# Patient Record
Sex: Male | Born: 1965 | Race: Black or African American | Hispanic: No | Marital: Married | Smoking: Former smoker
Health system: Southern US, Community
[De-identification: ages and names within clinical notes are randomized; demographics above are authoritative.]

## PROBLEM LIST (undated history)

## (undated) DIAGNOSIS — G4730 Sleep apnea, unspecified: Secondary | ICD-10-CM

## (undated) DIAGNOSIS — F419 Anxiety disorder, unspecified: Secondary | ICD-10-CM

## (undated) HISTORY — DX: Sleep apnea, unspecified: G47.30

## (undated) HISTORY — DX: Anxiety disorder, unspecified: F41.9

---

## 2006-12-17 ENCOUNTER — Emergency Department: Payer: Self-pay | Admitting: Emergency Medicine

## 2009-09-28 ENCOUNTER — Emergency Department: Payer: Self-pay | Admitting: Emergency Medicine

## 2010-04-05 ENCOUNTER — Emergency Department: Payer: Self-pay | Admitting: Emergency Medicine

## 2010-04-14 ENCOUNTER — Emergency Department: Payer: Self-pay | Admitting: Emergency Medicine

## 2015-01-08 ENCOUNTER — Other Ambulatory Visit: Payer: Self-pay | Admitting: Physician Assistant

## 2015-01-08 DIAGNOSIS — R221 Localized swelling, mass and lump, neck: Secondary | ICD-10-CM

## 2015-01-09 ENCOUNTER — Other Ambulatory Visit: Payer: Self-pay

## 2015-01-15 ENCOUNTER — Ambulatory Visit
Admission: RE | Admit: 2015-01-15 | Discharge: 2015-01-15 | Disposition: A | Discharge status: Home / Self Care | Payer: Commercial Managed Care - PPO | Source: Ambulatory Visit | Attending: Physician Assistant | Admitting: Physician Assistant

## 2015-01-15 DIAGNOSIS — R221 Localized swelling, mass and lump, neck: Secondary | ICD-10-CM | POA: Diagnosis present

## 2015-01-15 MED ORDER — IOHEXOL 350 MG/ML SOLN
75.0000 mL | Freq: Once | INTRAVENOUS | Status: AC | PRN
  Administered 2015-01-15: 75 mL via INTRAVENOUS

## 2015-01-19 ENCOUNTER — Encounter: Payer: Self-pay | Admitting: *Deleted

## 2015-01-20 ENCOUNTER — Encounter: Payer: Self-pay | Admitting: Oncology

## 2015-01-20 ENCOUNTER — Inpatient Hospital Stay: Payer: Commercial Managed Care - PPO

## 2015-01-20 ENCOUNTER — Inpatient Hospital Stay: Payer: Commercial Managed Care - PPO | Attending: Oncology | Admitting: Oncology

## 2015-01-20 VITALS — BP 133/87 | HR 65 | Temp 97.0°F | Wt 241.6 lb

## 2015-01-20 DIAGNOSIS — R945 Abnormal results of liver function studies: Secondary | ICD-10-CM | POA: Diagnosis not present

## 2015-01-20 DIAGNOSIS — Z801 Family history of malignant neoplasm of trachea, bronchus and lung: Secondary | ICD-10-CM | POA: Diagnosis not present

## 2015-01-20 DIAGNOSIS — Z87891 Personal history of nicotine dependence: Secondary | ICD-10-CM

## 2015-01-20 DIAGNOSIS — Z800 Family history of malignant neoplasm of digestive organs: Secondary | ICD-10-CM | POA: Diagnosis not present

## 2015-01-20 DIAGNOSIS — R221 Localized swelling, mass and lump, neck: Secondary | ICD-10-CM

## 2015-01-20 LAB — CBC WITH DIFFERENTIAL/PLATELET
BASOS ABS: 0.1 10*3/uL (ref 0–0.1)
Basophils Relative: 1 %
Eosinophils Absolute: 0.1 10*3/uL (ref 0–0.7)
Eosinophils Relative: 2 %
HCT: 46.8 % (ref 40.0–52.0)
HEMOGLOBIN: 15.7 g/dL (ref 13.0–18.0)
LYMPHS ABS: 4.2 10*3/uL — AB (ref 1.0–3.6)
Lymphocytes Relative: 62 %
MCH: 30.1 pg (ref 26.0–34.0)
MCHC: 33.5 g/dL (ref 32.0–36.0)
MCV: 89.6 fL (ref 80.0–100.0)
MONOS PCT: 8 %
Monocytes Absolute: 0.5 10*3/uL (ref 0.2–1.0)
Neutro Abs: 1.8 10*3/uL (ref 1.4–6.5)
Neutrophils Relative %: 27 %
PLATELETS: 200 10*3/uL (ref 150–440)
RBC: 5.22 MIL/uL (ref 4.40–5.90)
RDW: 13.8 % (ref 11.5–14.5)
WBC: 6.7 10*3/uL (ref 3.8–10.6)

## 2015-01-20 LAB — COMPREHENSIVE METABOLIC PANEL
ALT: 45 U/L (ref 17–63)
AST: 205 U/L — AB (ref 15–41)
Albumin: 4 g/dL (ref 3.5–5.0)
Alkaline Phosphatase: 78 U/L (ref 38–126)
Anion gap: 4 — ABNORMAL LOW (ref 5–15)
BUN: 13 mg/dL (ref 6–20)
CALCIUM: 8.8 mg/dL — AB (ref 8.9–10.3)
CO2: 27 mmol/L (ref 22–32)
CREATININE: 1.15 mg/dL (ref 0.61–1.24)
Chloride: 104 mmol/L (ref 101–111)
GFR calc Af Amer: >60 mL/min (ref >60)
Glucose, Bld: 92 mg/dL (ref 65–99)
Potassium: 3.9 mmol/L (ref 3.5–5.1)
Sodium: 135 mmol/L (ref 135–145)
Total Bilirubin: 0.6 mg/dL (ref 0.3–1.2)
Total Protein: 8.5 g/dL — ABNORMAL HIGH (ref 6.5–8.1)

## 2015-01-20 NOTE — Progress Notes (Signed)
[facility] Telephone:([phone]) [phone]  Fax:(336) [phone]   INITIAL CONSULT  Louis Ortiz OB: 12-02-1965  MR#: [id number]  VJK#:[id number]  Patient Care Team: Geoffery Lyons, MD as PCP - General (Physician Assistant) Linus Salmons, MD as Consulting Physician (Unknown Physician Specialty)  CHIEF COMPLAINT:  Chief Complaint  Patient presents with  . New Evaluation    VISIT DIAGNOSIS:     ICD-9-CM ICD-10-CM   1. Neck mass 784.2 R22.1 CBC with Differential     Comprehensive metabolic panel      No history exists.   2.  Abnormal CT scan of the neck (January 15, 2015) No flowsheet data found.  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  According to him he told us somewhat decrease in size.  Patient was evaluated by primary care physician and patient underwent extensive evaluation which sleep studies CT scan and ultrasound. CT scan was abnormal showing irregular thickening of the right lateral wall of oropharynx and hypopharynx extending 3 cm as the liver no pop or epiglottis.  There was some small enlarged lymph node were found.  This and also underwent ultrasound study of abdomen which was reported to be normal.  Patient was referred to me for abnormal CT scan of the head and neck. REVIEW OF SYSTEMS:  Gen. status: Good performance status. HEENT: Denies any difficulty swallowing pain and sore throat Lungs: No cough or shortness of breath Cardiac: No chest pain or shortness of breath GI: No abdominal pain no nausea.  No rectal bleeding. Lower extremity no swelling GU: No dysuria hematuria had documented normal PSA in July 2016 Musculoskeletal system no bony pain Endocrine system within normal limit Skin: No rash Neurological system: No tingling numbness or no other abnormality  As per HPI. Otherwise, a complete review of systems is negatve.  PAST MEDICAL HISTORY: Patient has allergic  rhinitis Insomnia Carpal tunnel syndrome  PAST SURGICAL HISTORY: No significant pasta surgical history  FAMILY HISTORY Family history of lung cancer and stomach cancer and diabetes     ADVANCED DIRECTIVES:  Does not have any advance care directive   HEALTH MAINTENANCE: History  Substance Use Topics  . Smoking status: Former Smoker    Quit date: 01/19/1994  . Smokeless tobacco: Not on file  . Alcohol Use: Not on file     Patient is social drinker  No Known Allergies  No current outpatient prescriptions on file.   No current facility-administered medications for this visit.    OBJECTIVE: PHYSICAL EXAM: Gen. status: Patient is alert oriented and not in any acute distress HEENT: Small palpable submandibular lymph node. Enlargement of both tonsils right bigger than left. No other mass visible Examination of the chest was unremarkable. There were no bony deformities, no asymmetry, and no other abnormalities.  Cardiac exam revealed the PMI to be normally situated and sized. The rhythm was regular and no extrasystoles were noted during several minutes of auscultation. The first and second heart sounds were normal and physiologic splitting of the second heart sound was noted. There were no murmurs, rubs, clicks, or gallops. Abdominal exam revealed normal bowel sounds. The abdomen was soft, non-tender, and without masses, organomegaly, or appreciable enlargement of the abdominal aorta. Examination of the skin revealed no evidence of significant rashes, suspicious appearing nevi or other concerning lesions. Musculoskeletal system: Within normal limits no joint swelling Neurologically, the patient was awake, alert, and oriented to person, place and time. There were  no obvious focal neurologic abnormalities.  Filed Vitals:   01/20/15 1528  BP: 133/87  Pulse: 65  Temp:      Body mass index is 32.76 kg/(m^2).    ECOG FS:0 - Asymptomatic  LAB RESULTS: Lab data from outside  institution WBC 6.5.  Hemoglobin 15.4.  Platelet count 296,000.  Serum creatinine 1.24.  Bilirubin 0.4.  Potassium 9.3.  SGOT 202 SGPT 47 alkaline phosphatase within normal limit   STUDIES: Ct Soft Tissue Neck W Contrast  01/16/2015   CLINICAL DATA:  Submandibular region swelling, 4 months duration.  EXAM: CT NECK WITH CONTRAST  TECHNIQUE: Multidetector CT imaging of the neck was performed using the standard protocol following the bolus administration of intravenous contrast.  CONTRAST:  75mL OMNIPAQUE IOHEXOL 350 MG/ML SOLN  COMPARISON:  Radiography 04/14/2010  FINDINGS: Pharynx and larynx: The areas irregular thickening of the right lateral wall of the oropharynx to hypopharynx, extending over a length of 3 cm as low as the level of the upper epiglottis. This is worrisome for carcinoma. No other mucosal or submucosal lesion seen.  Salivary glands: Parotid and submandibular glands are intrinsically normal.  Thyroid: Normal  Lymph nodes: There are enlarged level 1 lymph nodes on the right, the lymph node marked by the patient measuring 13 x 17 mm in diameter and a noted medially adjacent to the right submandibular gland measuring 11 x 21 mm in diameter. There are no enlarged level 2 or below lymph nodes by size criteria, but the lymph nodes in the right side of the neck compared to the left are slightly asymmetrically more prominent, raising some concern.  Vascular: Arterial and venous structures are patent. Mild atherosclerosis of the carotid bifurcations.  Limited intracranial: Normal  Visualized orbits: Normal  Mastoids and visualized paranasal sinuses: Clear  Skeleton: Spondylosis C5-6.  Upper chest: Clear  IMPRESSION: Thickening and irregularity along the right lateral oropharynx and hypopharynx extending over a length of 3 cm, worrisome for carcinoma. Enlarged level 1 lymph nodes worrisome for metastatic disease. Slight prominence of the nodes of the neck in the lower nodal stations compared to the left  raising some concern. However, these are not pathologic by size criteria at this time.   Electronically Signed   By: Paulina Fusi M.D.   On: 01/16/2015 08:10    ASSESSMENT:  1.  Abnormal  CT scan of the soft tissue of the neck.  CT scan has been reviewed independently and reviewed with the patient. 2, abnormal liver enzymes Outside ultrasound was with reported to be negative for abdomen Hepatitis profile has been negative for hepatitis B and C. PLAN: ENT evaluation Repeat liver enzymes If they continue to be abnormal then further CT scan of the liver can be done HIV evaluation if needed Depending on the further ENT evaluation and biopsy consider further treatment planning Discussed case in tumor conference.  Patient expressed understanding and was in agreement with this plan. He also understands that He can call clinic at any time with any questions, concerns, or complaints.    No matching staging information was found for the patient.  Johney Maine, MD   01/20/2015 4:54 PM

## 2015-01-20 NOTE — Progress Notes (Signed)
Patient does not have living will.  Former smoker. Quit 1995.

## 2015-01-21 ENCOUNTER — Ambulatory Visit: Payer: Commercial Managed Care - PPO | Admitting: Oncology

## 2015-01-29 ENCOUNTER — Ambulatory Visit: Payer: Self-pay | Admitting: General Surgery

## 2015-02-26 ENCOUNTER — Encounter: Payer: Self-pay | Admitting: *Deleted

## 2015-03-04 NOTE — Discharge Instructions (Signed)

## 2015-03-06 ENCOUNTER — Ambulatory Visit: Payer: Commercial Managed Care - PPO | Admitting: Anesthesiology

## 2015-03-06 ENCOUNTER — Encounter
Admission: RE | Disposition: A | Discharge status: Home / Self Care | Payer: Self-pay | Source: Ambulatory Visit | Attending: Unknown Physician Specialty

## 2015-03-06 ENCOUNTER — Encounter: Payer: Self-pay | Admitting: Anesthesiology

## 2015-03-06 ENCOUNTER — Ambulatory Visit
Admission: RE | Admit: 2015-03-06 | Discharge: 2015-03-06 | Disposition: A | Discharge status: Home / Self Care | Payer: Commercial Managed Care - PPO | Source: Ambulatory Visit | Attending: Unknown Physician Specialty | Admitting: Unknown Physician Specialty

## 2015-03-06 DIAGNOSIS — R748 Abnormal levels of other serum enzymes: Secondary | ICD-10-CM | POA: Diagnosis not present

## 2015-03-06 DIAGNOSIS — Z809 Family history of malignant neoplasm, unspecified: Secondary | ICD-10-CM | POA: Insufficient documentation

## 2015-03-06 DIAGNOSIS — Z833 Family history of diabetes mellitus: Secondary | ICD-10-CM | POA: Diagnosis not present

## 2015-03-06 DIAGNOSIS — K219 Gastro-esophageal reflux disease without esophagitis: Secondary | ICD-10-CM | POA: Diagnosis not present

## 2015-03-06 DIAGNOSIS — R590 Localized enlarged lymph nodes: Secondary | ICD-10-CM | POA: Insufficient documentation

## 2015-03-06 DIAGNOSIS — Z811 Family history of alcohol abuse and dependence: Secondary | ICD-10-CM | POA: Insufficient documentation

## 2015-03-06 DIAGNOSIS — Z8261 Family history of arthritis: Secondary | ICD-10-CM | POA: Insufficient documentation

## 2015-03-06 DIAGNOSIS — Z87891 Personal history of nicotine dependence: Secondary | ICD-10-CM | POA: Diagnosis not present

## 2015-03-06 DIAGNOSIS — R221 Localized swelling, mass and lump, neck: Secondary | ICD-10-CM | POA: Diagnosis present

## 2015-03-06 HISTORY — PX: MASS EXCISION: SHX2000

## 2015-03-06 SURGERY — MINOR EXCISION OF MASS
Anesthesia: General | Wound class: Clean

## 2015-03-06 MED ORDER — OXYCODONE HCL 5 MG/5ML PO SOLN: 5.0000 mg | Freq: Once | ORAL | Status: DC | PRN

## 2015-03-06 MED ORDER — OXYCODONE HCL 5 MG PO TABS: 5.0000 mg | ORAL_TABLET | Freq: Once | ORAL | Status: DC | PRN

## 2015-03-06 MED ORDER — DEXAMETHASONE SODIUM PHOSPHATE 4 MG/ML IJ SOLN
INTRAMUSCULAR | Status: DC | PRN
  Administered 2015-03-06: 10 mg via INTRAVENOUS

## 2015-03-06 MED ORDER — LACTATED RINGERS IV SOLN
INTRAVENOUS | Status: DC
  Administered 2015-03-06: 12:00:00 via INTRAVENOUS

## 2015-03-06 MED ORDER — ONDANSETRON HCL 4 MG/2ML IJ SOLN
INTRAMUSCULAR | Status: DC | PRN
  Administered 2015-03-06: 4 mg via INTRAVENOUS

## 2015-03-06 MED ORDER — HYDROCODONE-ACETAMINOPHEN 5-300 MG PO TABS: 1.0000 | ORAL_TABLET | ORAL | Status: DC | PRN

## 2015-03-06 MED ORDER — FENTANYL CITRATE (PF) 100 MCG/2ML IJ SOLN
INTRAMUSCULAR | Status: DC | PRN
  Administered 2015-03-06: 100 ug via INTRAVENOUS

## 2015-03-06 MED ORDER — MIDAZOLAM HCL 5 MG/5ML IJ SOLN
INTRAMUSCULAR | Status: DC | PRN
  Administered 2015-03-06: 2 mg via INTRAVENOUS

## 2015-03-06 MED ORDER — LIDOCAINE-EPINEPHRINE 1 %-1:100000 IJ SOLN
INTRAMUSCULAR | Status: DC | PRN
  Administered 2015-03-06: 8 mL

## 2015-03-06 MED ORDER — PROPOFOL 10 MG/ML IV BOLUS
INTRAVENOUS | Status: DC | PRN
  Administered 2015-03-06: 200 mg via INTRAVENOUS

## 2015-03-06 MED ORDER — GLYCOPYRROLATE 0.2 MG/ML IJ SOLN
INTRAMUSCULAR | Status: DC | PRN
  Administered 2015-03-06: .1 mg via INTRAVENOUS

## 2015-03-06 MED ORDER — LIDOCAINE HCL (CARDIAC) 20 MG/ML IV SOLN
INTRAVENOUS | Status: DC | PRN
  Administered 2015-03-06: 50 mg via INTRATRACHEAL

## 2015-03-06 SURGICAL SUPPLY — 28 items
APPLICATOR COTTON TIP WD 3 STR (MISCELLANEOUS) IMPLANT
BLADE SURG 15 STRL LF DISP TIS (BLADE) IMPLANT
BLADE SURG 15 STRL SS (BLADE)
CANISTER SUCT 1200ML W/VALVE (MISCELLANEOUS) IMPLANT
CORD BIP STRL DISP 12FT (MISCELLANEOUS) IMPLANT
DRAPE HEAD BAR (DRAPES) IMPLANT
DRESSING TELFA 4X3 1S ST N-ADH (GAUZE/BANDAGES/DRESSINGS) IMPLANT
ELECT CAUTERY BLADE TIP 2.5 (TIP) ×3
ELECT CAUTERY NEEDLE 2.0 MIC (NEEDLE) IMPLANT
ELECTRODE CAUTERY BLDE TIP 2.5 (TIP) ×1 IMPLANT
GAUZE SPONGE 4X4 12PLY STRL (GAUZE/BANDAGES/DRESSINGS) IMPLANT
GLOVE BIO SURGEON STRL SZ7.5 (GLOVE) ×6 IMPLANT
LIQUID BAND (GAUZE/BANDAGES/DRESSINGS) IMPLANT
NEEDLE HYPO 25GX1X1/2 BEV (NEEDLE) ×3 IMPLANT
NS IRRIG 500ML POUR BTL (IV SOLUTION) ×3 IMPLANT
PACK DRAPE NASAL/ENT (PACKS) ×3 IMPLANT
PAD GROUND ADULT SPLIT (MISCELLANEOUS) ×3 IMPLANT
PENCIL ELECTRO HAND CTR (MISCELLANEOUS) ×3 IMPLANT
SOL PREP PVP 2OZ (MISCELLANEOUS)
SOLUTION PREP PVP 2OZ (MISCELLANEOUS) IMPLANT
STRAP BODY AND KNEE 60X3 (MISCELLANEOUS) ×3 IMPLANT
SUCTION FRAZIER TIP 10 FR DISP (SUCTIONS) IMPLANT
SUT PROLENE 4 0 PS 2 18 (SUTURE) ×3 IMPLANT
SUT PROLENE 5 0 P 3 (SUTURE) IMPLANT
SUT VIC AB 4-0 RB1 27 (SUTURE)
SUT VIC AB 4-0 RB1 27X BRD (SUTURE) IMPLANT
SYRINGE 10CC LL (SYRINGE) ×3 IMPLANT
TOWEL OR 17X26 4PK STRL BLUE (TOWEL DISPOSABLE) ×3 IMPLANT

## 2015-03-06 NOTE — Anesthesia Postprocedure Evaluation (Signed)
  Anesthesia Post-op Note  Patient: Louis Ortiz  Procedure(s) Performed: Procedure(s): MINOR EXCISION OF MASS EXC DEEP SUBMENTAL LYMPH NODE (N/A)  Anesthesia type:General  Patient location: PACU  Post pain: Pain level controlled  Post assessment: Post-op Vital signs reviewed, Patient's Cardiovascular Status Stable, Respiratory Function Stable, Patent Airway and No signs of Nausea or vomiting  Post vital signs: Reviewed and stable  Last Vitals:  Filed Vitals:   03/06/15 1346  BP: 107/71  Pulse: 55  Temp:   Resp: 13    Level of consciousness: awake, alert  and patient cooperative  Complications: No apparent anesthesia complications

## 2015-03-06 NOTE — Anesthesia Procedure Notes (Signed)
Procedure Name: LMA Insertion Performed by: Jeani Fassnacht Pre-anesthesia Checklist: Patient identified, Emergency Drugs available, Suction available, Timeout performed and Patient being monitored Patient Re-evaluated:Patient Re-evaluated prior to inductionOxygen Delivery Method: Circle system utilized Preoxygenation: Pre-oxygenation with 100% oxygen Intubation Type: IV induction LMA: LMA inserted LMA Size: 5.0 Number of attempts: 1 Placement Confirmation: positive ETCO2 and breath sounds checked- equal and bilateral Tube secured with: Tape     

## 2015-03-06 NOTE — Anesthesia Preprocedure Evaluation (Addendum)
Anesthesia Evaluation  Patient identified by MRN, date of birth, ID band  Reviewed: NPO status   History of Anesthesia Complications Negative for: history of anesthetic complications  Airway Mallampati: II  TM Distance: >3 FB Neck ROM: full    Dental  (+) Missing, Chipped, Poor Dentition,    Pulmonary neg pulmonary ROS, former smoker,    Pulmonary exam normal       Cardiovascular Exercise Tolerance: Good negative cardio ROS Normal cardiovascular exam    Neuro/Psych negative neurological ROS  negative psych ROS   GI/Hepatic negative GI ROS, Elevated LFTs.   Endo/Other  negative endocrine ROS  Renal/GU negative Renal ROS  negative genitourinary   Musculoskeletal   Abdominal   Peds  Hematology negative hematology ROS (+)   Anesthesia Other Findings Neck CT: Thickening and irregularity along the right lateral oropharynx and hypopharynx extending over a length of 3 cm.  Reproductive/Obstetrics                            Anesthesia Physical Anesthesia Plan  ASA: II  Anesthesia Plan: General   Post-op Pain Management:    Induction:   Airway Management Planned:   Additional Equipment:   Intra-op Plan:   Post-operative Plan:   Informed Consent: I have reviewed the patients History and Physical, chart, labs and discussed the procedure including the risks, benefits and alternatives for the proposed anesthesia with the patient or authorized representative who has indicated his/her understanding and acceptance.     Plan Discussed with: CRNA  Anesthesia Plan Comments:        Anesthesia Quick Evaluation

## 2015-03-06 NOTE — Transfer of Care (Signed)
Immediate Anesthesia Transfer of Care Note  Patient: Louis Ortiz  Procedure(s) Performed: Procedure(s): MINOR EXCISION OF MASS EXC DEEP SUBMENTAL LYMPH NODE (N/A)  Patient Location: PACU  Anesthesia Type: General  Level of Consciousness: awake, alert  and patient cooperative  Airway and Oxygen Therapy: Patient Spontanous Breathing and Patient connected to supplemental oxygen  Post-op Assessment: Post-op Vital signs reviewed, Patient's Cardiovascular Status Stable, Respiratory Function Stable, Patent Airway and No signs of Nausea or vomiting  Post-op Vital Signs: Reviewed and stable  Complications: No apparent anesthesia complications

## 2015-03-06 NOTE — Brief Op Note (Signed)
03/06/2015  1:22 PM  PATIENT:  Louis Ortiz  49 y.o. male  PRE-OPERATIVE DIAGNOSIS:  NECK MASS R22.1  POST-OPERATIVE DIAGNOSIS:  * No post-op diagnosis entered *  PROCEDURE:  Procedure(s): MINOR EXCISION OF MASS EXC DEEP SUBMENTAL LYMPH NODE (N/A)  SURGEON:  Surgeon(s) and Role:    * Linus Salmons, MD - Primary  PHYSICIAN ASSISTANT:   ASSISTANTS: none   ANESTHESIA:   general  EBL:     BLOOD ADMINISTERED:none  DRAINS: none   LOCAL MEDICATIONS USED:  LIDOCAINE   SPECIMEN:  Source of Specimen:  submental lymph node  DISPOSITION OF SPECIMEN:  PATHOLOGY  COUNTS:  NO yes they were correct  TOURNIQUET:  * No tourniquets in log *  DICTATION: .Other Dictation: Dictation Number [id number]  PLAN OF CARE: Discharge to home after PACU  PATIENT DISPOSITION:  PACU - hemodynamically stable.   Delay start of Pharmacological VTE agent (>24hrs) due to surgical blood loss or risk of bleeding: not applicable

## 2015-03-06 NOTE — H&P (Signed)
  H+P  Reviewed and will be scanned in later. No changes noted. 

## 2015-03-07 NOTE — Op Note (Signed)
NAMEGAEGE, SANGALANG NO.:  [id number]  MEDICAL RECORD NO.:  [id number]  LOCATION:  [facility]                        FACILITY:  [facility]  PHYSICIAN:  Davina Poke, MD  DATE OF BIRTH:  04/09/1966  DATE OF PROCEDURE:  03/06/2015 DATE OF DISCHARGE:  03/06/2015                              OPERATIVE REPORT   PREOPERATIVE DIAGNOSIS:  Submental lymph node.  POSTOPERATIVE DIAGNOSIS:  Submental lymph node.  ATTENDING SURGEON:  Davina Poke, M.D.  PROCEDURE PERFORMED:  Excision of a deep submental lymph node.  SURGEON:  Davina Poke, MD  OPERATIVE FINDINGS:  Approximately 2 x 2 cm soft lymph node in the submental region.  DESCRIPTION OF PROCEDURE:  Carlin was identified in the holding area, taken to the operating room, placed in supine position.  After general laryngeal mask anesthesia, the neck was prepped and draped sterilely. An incision line was marked overlying the palpable mass.  A local anesthetic of 1% lidocaine with 1:1000 epinephrine was used to inject over this.  A total of 2 mL was used.  When this completed, a 15 blade was used to incise down to and through the platysmal muscle.  Hemostasis was achieved using the bipolar cautery.  Using blunt dissection and sharp dissection, the wound was probed.  There was an obvious lymph node which was palpable.  This was gently dissected free.  There was 1 small feeding vessel which was divided between clamps and sutured using a 2-0 silk suture ligature.  The micro-bipolar was then used to remove the lymph node from its bed.  When this completed, the wound was copiously irrigated with saline.  Any small bleeding points were cauterized using the micro-bipolar.  The platysmal layer was closed using 4-0 Vicryl, and the skin was closed using 4-0 running Prolene.  The patient was then returned to Anesthesia where he was awakened in the operating room, taken to the recovery room in stable  condition.  CULTURES:  None.  SPECIMEN:  Submental lymph node.  ESTIMATED BLOOD LOSS:  Less than 5 mL.          ______________________________ Davina Poke, MD     CTM/MEDQ  D:  03/06/2015  T:  03/07/2015  Job:  [id number]

## 2015-03-09 ENCOUNTER — Encounter: Payer: Self-pay | Admitting: Unknown Physician Specialty

## 2015-03-11 ENCOUNTER — Encounter: Payer: Self-pay | Admitting: Oncology

## 2015-03-12 LAB — SURGICAL PATHOLOGY

## 2015-08-11 IMAGING — CT CT NECK W/ CM
4 of 5 series · 15 of 33 positions shown, 17 images · IV contrast (omnipaque)
Comparison: Radiography 04/14/2010

CLINICAL DATA: Submandibular region swelling, 4 months duration.

EXAM:
CT NECK WITH CONTRAST
TECHNIQUE: Multidetector CT imaging of the neck was performed using the
standard protocol following the bolus administration of intravenous
contrast.
CONTRAST:  75mL OMNIPAQUE IOHEXOL 350 MG/ML SOLN

[Series 2: axial · axial · 0.61mm/px · z∈[-289,-145]mm · 3 of 145 slices shown]
[im 37/145  bone]
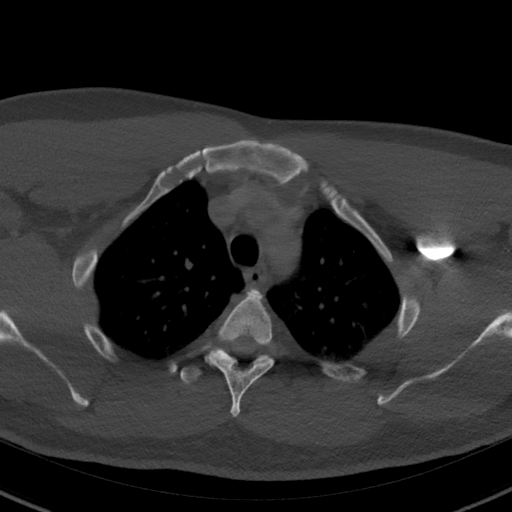
[im 73/145  bone]
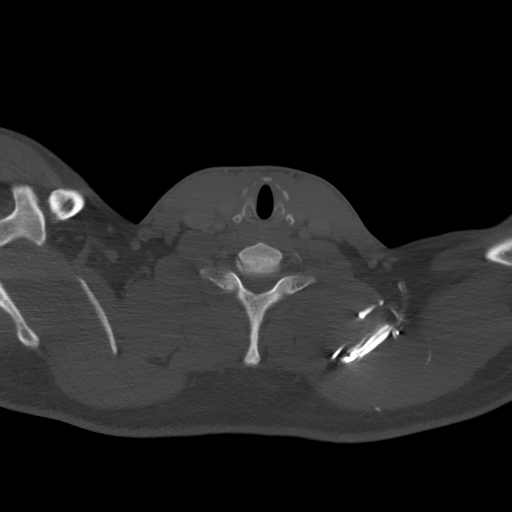
[im 109/145  bone]
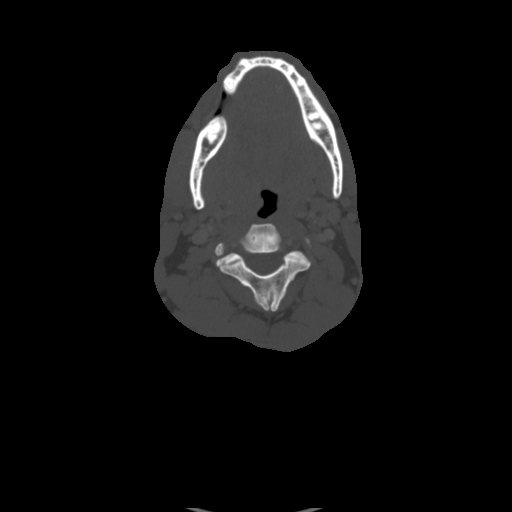

[Series 4: sag neck · sagittal · 0.49mm/px · 5 of 157 slices shown, 6 images]
[im 53/157  bone]
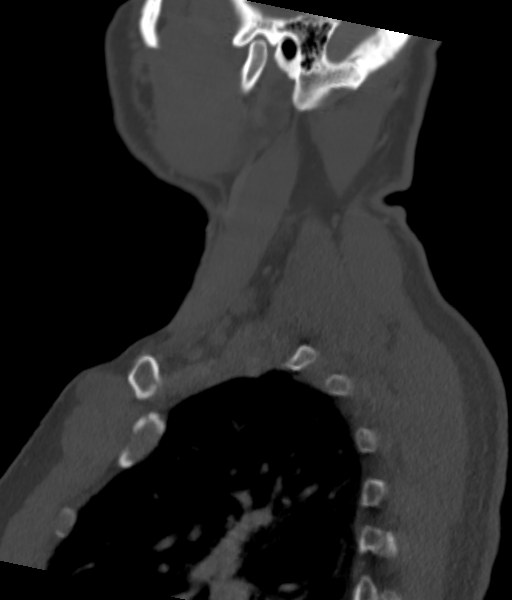
[im 66/157  bone]
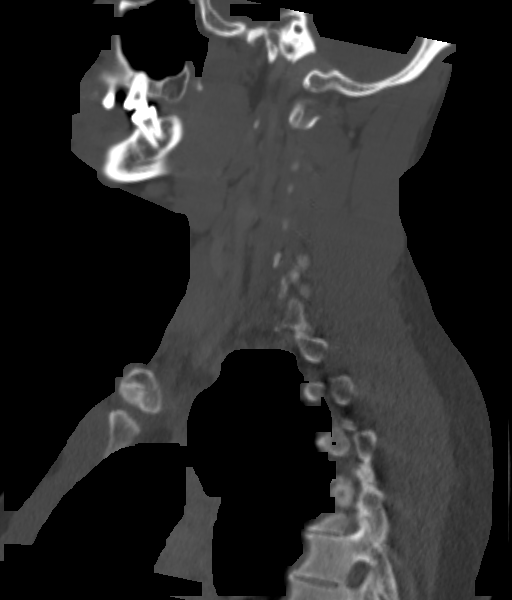
[im 79/157  soft-tissue]
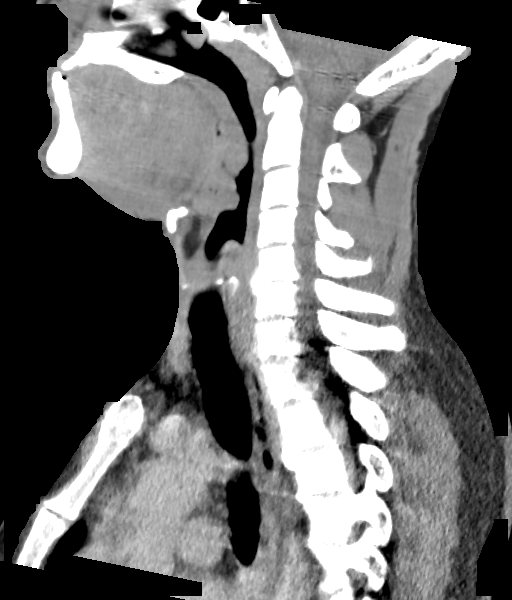
[im 79/157  bone]
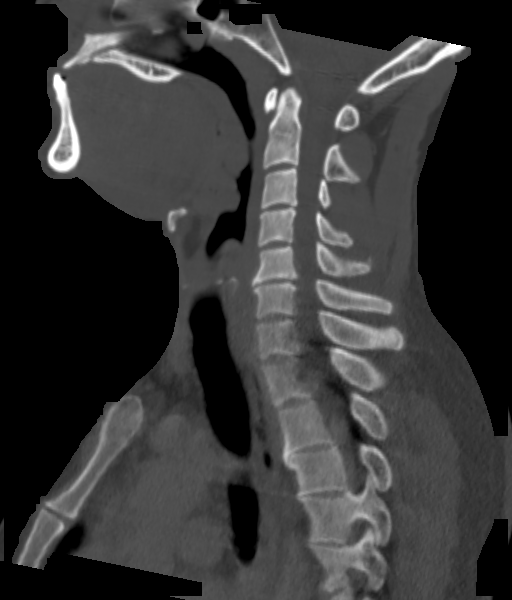
[im 92/157  bone]
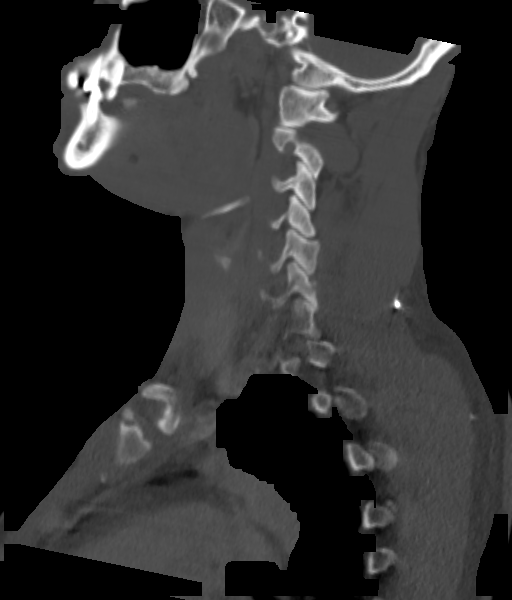
[im 105/157  bone]
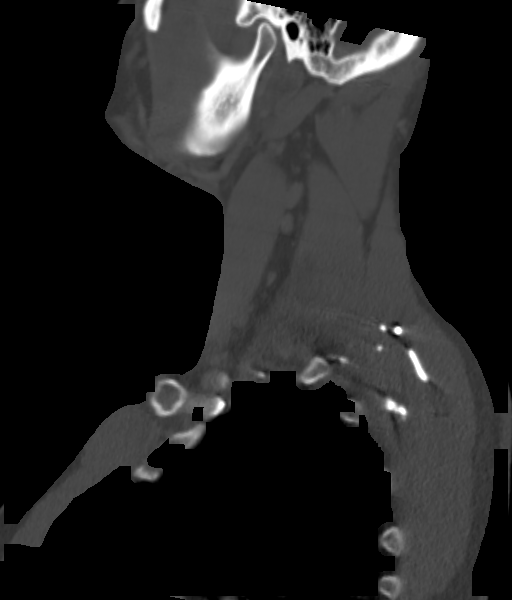

[Series 5: cor neck · coronal · 0.60mm/px · 3 of 123 slices shown]
[im 27/123  bone]
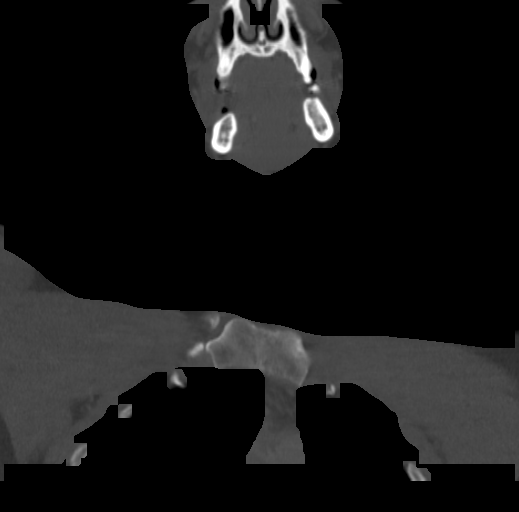
[im 50/123  bone]
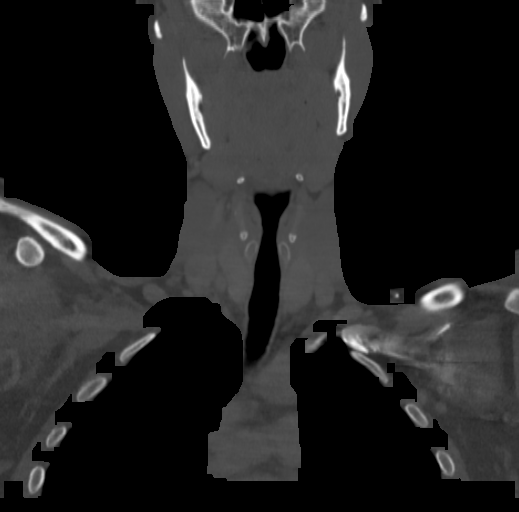
[im 73/123  bone]
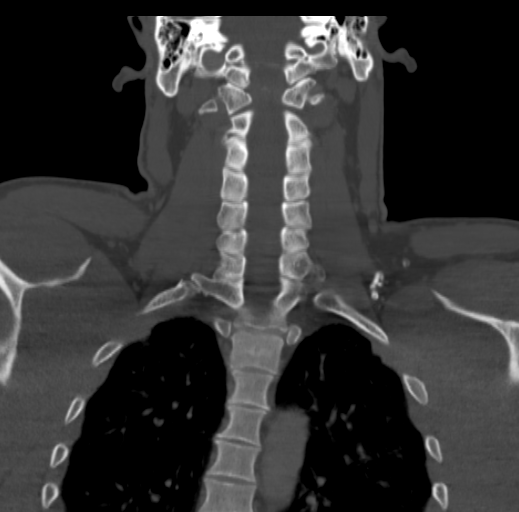

[Series 6: ax oropharynx · axial · 0.50mm/px · z∈[-336,-152]mm · 4 of 158 slices shown, 5 images]
[im 32/158  soft-tissue]
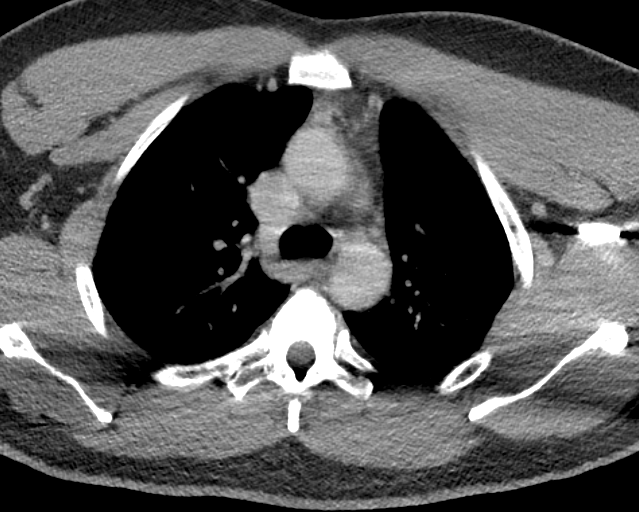
[im 32/158  bone]
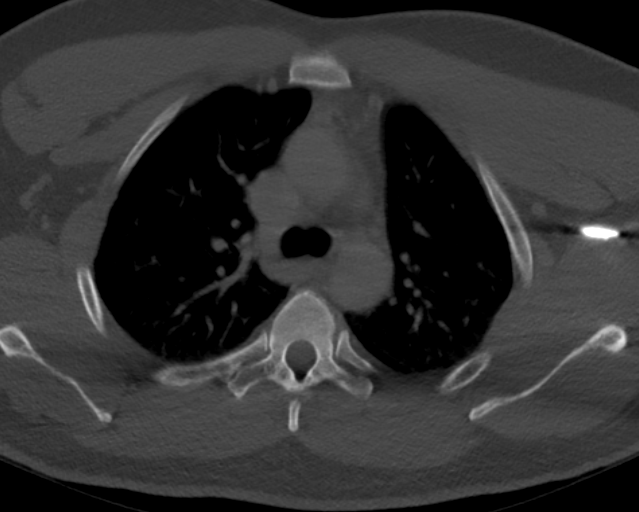
[im 63/158  bone]
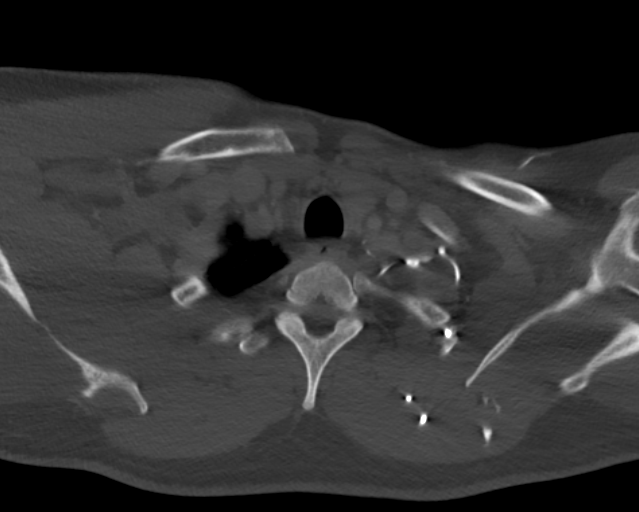
[im 95/158  bone]
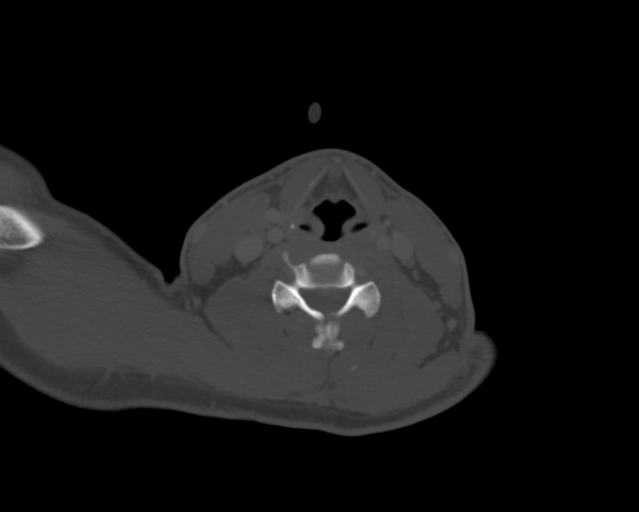
[im 126/158  bone]
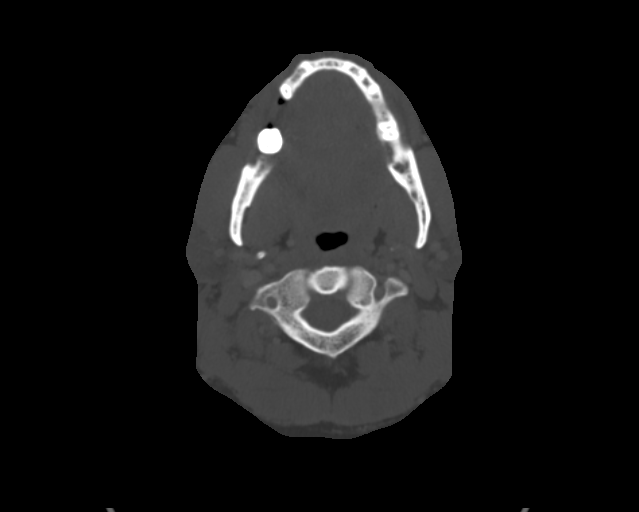

[15 of 33 positions shown; findings below may reference images not displayed]

FINDINGS: Pharynx and larynx: The areas irregular thickening of the right
lateral wall of the oropharynx to hypopharynx, extending over a
length of 3 cm as low as the level of the upper epiglottis. This is
worrisome for carcinoma. No other mucosal or submucosal lesion seen.

Salivary glands: Parotid and submandibular glands are intrinsically
normal.

Thyroid: Normal

Lymph nodes: There are enlarged level 1 lymph nodes on the right,
the lymph node marked by the patient measuring 13 x 17 mm in
diameter and a noted medially adjacent to the right submandibular
gland measuring 11 x 21 mm in diameter. There are no enlarged level
2 or below lymph nodes by size criteria, but the lymph nodes in the
right side of the neck compared to the left are slightly
asymmetrically more prominent, raising some concern.

Vascular: Arterial and venous structures are patent. Mild
atherosclerosis of the carotid bifurcations.

Limited intracranial: Normal

Visualized orbits: Normal

Mastoids and visualized paranasal sinuses: Clear

Skeleton: Spondylosis C5-6.

Upper chest: Clear
IMPRESSION: Thickening and irregularity along the right lateral oropharynx and
hypopharynx extending over a length of 3 cm, worrisome for
carcinoma. Enlarged level 1 lymph nodes worrisome for metastatic
disease. Slight prominence of the nodes of the neck in the lower
nodal stations compared to the left raising some concern. However,
these are not pathologic by size criteria at this time.

## 2015-10-05 ENCOUNTER — Ambulatory Visit: Payer: Self-pay | Admitting: Urology

## 2015-10-13 ENCOUNTER — Encounter: Payer: Self-pay | Admitting: Urology

## 2015-10-13 ENCOUNTER — Ambulatory Visit (INDEPENDENT_AMBULATORY_CARE_PROVIDER_SITE_OTHER): Payer: Commercial Managed Care - PPO | Admitting: Urology

## 2015-10-13 VITALS — BP 127/84 | HR 77 | Ht 73.0 in | Wt 263.6 lb

## 2015-10-13 DIAGNOSIS — N529 Male erectile dysfunction, unspecified: Secondary | ICD-10-CM | POA: Insufficient documentation

## 2015-10-13 DIAGNOSIS — R300 Dysuria: Secondary | ICD-10-CM | POA: Diagnosis not present

## 2015-10-13 LAB — URINALYSIS, COMPLETE
Bilirubin, UA: NEGATIVE
Glucose, UA: NEGATIVE
KETONES UA: NEGATIVE
LEUKOCYTES UA: NEGATIVE
NITRITE UA: NEGATIVE
PH UA: 5.5 (ref 5.0–7.5)
Protein, UA: NEGATIVE
RBC, UA: NEGATIVE
Specific Gravity, UA: 1.01 (ref 1.005–1.030)
Urobilinogen, Ur: 1 mg/dL (ref 0.2–1.0)

## 2015-10-13 LAB — MICROSCOPIC EXAMINATION
Bacteria, UA: NONE SEEN
Epithelial Cells (non renal): NONE SEEN /hpf (ref 0–10)
RBC, UA: NONE SEEN /hpf (ref 0–?)
WBC, UA: NONE SEEN /hpf (ref 0–?)

## 2015-10-13 MED ORDER — LIDOCAINE 4 % EX GEL: 1.0000 "application " | CUTANEOUS | Status: DC | PRN

## 2015-10-13 NOTE — Progress Notes (Signed)
10/13/2015 4:15 PM   Louis Ortiz 1965-12-13 [id number]  Referring provider: Geoffery Lyons, PA [address] [place], [place] [address]  Chief Complaint  Patient presents with  . New Patient (Initial Visit)    ED, dysuria    HPI: Louis Ortiz is a 50yo seen in consultation for erectile dysfunction and dysuria. He has had intermittent dysuria for several years which occurs at the beginning of urination. He denies any other LUTS. No exacerbating or alleviating events On further questioning the patient does not have issues getting on erection but he does have issues with ejaculation. He ejaculates within 1 minute of penetration and then he is concerned that he loses his erection.    He has issues with decreased libido and fatigue.     PMH: Past Medical History  Diagnosis Date  . Anxiety   . Sleep hypopnea     Surgical History: Past Surgical History  Procedure Laterality Date  . Mass excision N/A 03/06/2015    Procedure: MINOR EXCISION OF MASS EXC DEEP SUBMENTAL LYMPH NODE;  Surgeon: Linus Salmons, MD;  Location: [facility];  Service: ENT;  Laterality: N/A;    Home Medications:    Medication List       This list is accurate as of: 10/13/15  4:15 PM.  Always use your most recent med list.               CLARITIN-D 24 HOUR PO  Take 1 tablet by mouth.     Hydrocodone-Acetaminophen 5-300 MG Tabs  Take 1-2 tablets by mouth every 4 (four) hours as needed.        Allergies: No Known Allergies  Family History: Family History  Problem Relation Age of Onset  . Diabetes Mother   . Hematuria Brother   . Kidney failure Brother   . Lung cancer Brother     Social History:  reports that he quit smoking about 21 years ago. He has never used smokeless tobacco. He reports that he does not drink alcohol or use illicit drugs.  ROS: UROLOGY Frequent Urination?: No Hard to postpone urination?: Yes Burning/pain with urination?: Yes Get up at night to urinate?:  No Leakage of urine?: Yes Urine stream starts and stops?: Yes Trouble starting stream?: No Do you have to strain to urinate?: No Blood in urine?: No Urinary tract infection?: Yes Sexually transmitted disease?: No Injury to kidneys or bladder?: No Painful intercourse?: No Weak stream?: Yes Erection problems?: Yes Penile pain?: No  Gastrointestinal Nausea?: No Vomiting?: No Indigestion/heartburn?: Yes Diarrhea?: No Constipation?: No  Constitutional Fever: No Night sweats?: No Weight loss?: No Fatigue?: No  Skin Skin rash/lesions?: No Itching?: No  Eyes Blurred vision?: No Double vision?: No  Ears/Nose/Throat Sore throat?: No Sinus problems?: Yes  Hematologic/Lymphatic Swollen glands?: No Easy bruising?: No  Cardiovascular Leg swelling?: No Chest pain?: No  Respiratory Cough?: No Shortness of breath?: No  Endocrine Excessive thirst?: No  Musculoskeletal Back pain?: Yes Joint pain?: No  Neurological Headaches?: No Dizziness?: No  Psychologic Depression?: No Anxiety?: No  Physical Exam: BP 127/84 mmHg  Pulse 77  Ht  (1.854 m)  Wt 119.568 kg (263 lb 9.6 oz)  BMI 34.79 kg/m2  Constitutional:  Alert and oriented, No acute distress. HEENT: [place] AT, moist mucus membranes.  Trachea midline, no masses. Cardiovascular: No clubbing, cyanosis, or edema. Respiratory: Normal respiratory effort, no increased work of breathing. GI: Abdomen is soft, nontender, nondistended, no abdominal masses GU: No CVA tenderness. circumcised phallus,  non masses/lesions on penis, testes, scrotum. Pt defers DRE Skin: No rashes, bruises or suspicious lesions. Lymph: No cervical or inguinal adenopathy. Neurologic: Grossly intact, no focal deficits, moving all 4 extremities. Psychiatric: Normal mood and affect.  Laboratory Data: Lab Results  Component Value Date   WBC 6.7 01/20/2015   HGB 15.7 01/20/2015   HCT 46.8 01/20/2015   MCV 89.6 01/20/2015   PLT 200  01/20/2015    Lab Results  Component Value Date   CREATININE 1.15 01/20/2015    No results found for: PSA  No results found for: TESTOSTERONE  No results found for: HGBA1C  Urinalysis No results found for: COLORURINE, APPEARANCEUR, LABSPEC, PHURINE, GLUCOSEU, HGBUR, BILIRUBINUR, KETONESUR, PROTEINUR, UROBILINOGEN, NITRITE, LEUKOCYTESUR  Pertinent Imaging: none  Assessment & Plan:    1. Dysuria -pt instructed on proper pelvic floor relaxation prior to initiation of urination - Urinalysis, Complete  2. Erectile dysfunction, unspecified erectile dysfunction type -testosterone free and total -lidocaine gel   No Follow-up on file.  Malen GauzeMCKENZIE, Fiona Coto L, MD  [facility] [address], [address] [place], [place] [id number]

## 2015-10-21 ENCOUNTER — Other Ambulatory Visit: Payer: Self-pay

## 2015-10-21 DIAGNOSIS — E291 Testicular hypofunction: Secondary | ICD-10-CM

## 2015-10-22 ENCOUNTER — Other Ambulatory Visit: Payer: Commercial Managed Care - PPO

## 2015-10-22 DIAGNOSIS — E291 Testicular hypofunction: Secondary | ICD-10-CM

## 2015-10-24 LAB — TESTOSTERONE,FREE AND TOTAL
TESTOSTERONE FREE: 10 pg/mL (ref 7.2–24.0)
Testosterone: 395 ng/dL (ref 348–1197)

## 2015-11-10 ENCOUNTER — Ambulatory Visit (INDEPENDENT_AMBULATORY_CARE_PROVIDER_SITE_OTHER): Payer: Commercial Managed Care - PPO | Admitting: Urology

## 2015-11-10 ENCOUNTER — Encounter: Payer: Self-pay | Admitting: Urology

## 2015-11-10 VITALS — BP 133/86 | HR 76 | Ht 72.0 in | Wt 253.7 lb

## 2015-11-10 DIAGNOSIS — R300 Dysuria: Secondary | ICD-10-CM

## 2015-11-10 DIAGNOSIS — N529 Male erectile dysfunction, unspecified: Secondary | ICD-10-CM | POA: Diagnosis not present

## 2015-11-10 LAB — URINALYSIS, COMPLETE
BILIRUBIN UA: NEGATIVE
Glucose, UA: NEGATIVE
LEUKOCYTES UA: NEGATIVE
Nitrite, UA: NEGATIVE
PH UA: 7 (ref 5.0–7.5)
RBC UA: NEGATIVE
SPEC GRAV UA: 1.02 (ref 1.005–1.030)
Urobilinogen, Ur: 2 mg/dL — ABNORMAL HIGH (ref 0.2–1.0)

## 2015-11-10 LAB — MICROSCOPIC EXAMINATION: BACTERIA UA: NONE SEEN

## 2015-11-10 MED ORDER — PAROXETINE HCL 10 MG PO TABS: 10.0000 mg | ORAL_TABLET | Freq: Every day | ORAL | Status: DC

## 2015-11-10 NOTE — Progress Notes (Signed)
11/10/2015 3:57 PM   Louis Ortiz 1966/02/15 [id number]  Referring provider: Geoffery LyonsEric M Turner, PA [address] [place], [place] [address]  Chief Complaint  Patient presents with  . Follow-up    ED, dysuria    HPI: Louis Ortiz is a 49yo seen in followup for dysuria and premature ejaculation. His dysuria has significantly improved with pelvic floor relaxation.  His premature ejaculation has not improved since his last visit.    PMH: Past Medical History  Diagnosis Date  . Anxiety   . Sleep hypopnea     Surgical History: Past Surgical History  Procedure Laterality Date  . Mass excision N/A 03/06/2015    Procedure: MINOR EXCISION OF MASS EXC DEEP SUBMENTAL LYMPH NODE;  Surgeon: Linus Salmonshapman McQueen, MD;  [facility];  Service: ENT;  Laterality: N/A;    Home Medications:    Medication List       This list is accurate as of: 11/10/15  3:57 PM.  Always use your most recent med list.               CLARITIN-D 24 HOUR PO  Take 1 tablet by mouth. Reported on 11/10/2015     Hydrocodone-Acetaminophen 5-300 MG Tabs  Take 1-2 tablets by mouth every 4 (four) hours as needed.     Lidocaine 4 % Gel  Apply 1 application topically as needed.     lisinopril 10 MG tablet  Commonly known as:  PRINIVIL,ZESTRIL  Reported on 11/10/2015        Allergies: No Known Allergies  Family History: Family History  Problem Relation Age of Onset  . Diabetes Mother   . Hematuria Brother   . Kidney failure Brother   . Lung cancer Brother     Social History:  reports that he quit smoking about 21 years ago. He has never used smokeless tobacco. He reports that he does not drink alcohol or use illicit drugs.  ROS: UROLOGY Frequent Urination?: No Hard to postpone urination?: No Burning/pain with urination?: No Get up at night to urinate?: No Leakage of urine?: No Urine stream starts and stops?: No Trouble starting stream?: No Do you have to strain to urinate?: No Blood in  urine?: No Urinary tract infection?: No Sexually transmitted disease?: No Injury to kidneys or bladder?: No Painful intercourse?: No Weak stream?: No Erection problems?: No Penile pain?: No  Gastrointestinal Nausea?: No Vomiting?: No Indigestion/heartburn?: Yes Diarrhea?: No Constipation?: No  Constitutional Fever: No Night sweats?: No Weight loss?: No Fatigue?: Yes  Skin Skin rash/lesions?: No Itching?: No  Eyes Blurred vision?: No Double vision?: No  Ears/Nose/Throat Sore throat?: No Sinus problems?: Yes  Hematologic/Lymphatic Swollen glands?: No Easy bruising?: No  Cardiovascular Leg swelling?: No Chest pain?: No  Respiratory Cough?: No Shortness of breath?: No  Endocrine Excessive thirst?: No  Musculoskeletal Back pain?: Yes Joint pain?: No  Neurological Headaches?: No Dizziness?: No  Psychologic Depression?: No Anxiety?: No  Physical Exam: BP 133/86 mmHg  Pulse 76  Ht 6' (1.829 m)  Wt 115.078 kg (253 lb 11.2 oz)  BMI 34.40 kg/m2  Constitutional:  Alert and oriented, No acute distress. HEENT: [place] AT, moist mucus membranes.  Trachea midline, no masses. Cardiovascular: No clubbing, cyanosis, or edema. Respiratory: Normal respiratory effort, no increased work of breathing. GI: Abdomen is soft, nontender, nondistended, no abdominal masses GU: No CVA tenderness.  Skin: No rashes, bruises or suspicious lesions. Lymph: No cervical or inguinal adenopathy. Neurologic: Grossly intact, no focal deficits, moving all 4  extremities. Psychiatric: Normal mood and affect.  Laboratory Data: Lab Results  Component Value Date   WBC 6.7 01/20/2015   HGB 15.7 01/20/2015   HCT 46.8 01/20/2015   MCV 89.6 01/20/2015   PLT 200 01/20/2015    Lab Results  Component Value Date   CREATININE 1.15 01/20/2015    No results found for: PSA  Lab Results  Component Value Date   TESTOSTERONE 395 10/22/2015    No results found for:  HGBA1C  Urinalysis    Component Value Date/Time   APPEARANCEUR Clear 10/13/2015 1548   GLUCOSEU Negative 10/13/2015 1548   BILIRUBINUR Negative 10/13/2015 1548   PROTEINUR Negative 10/13/2015 1548   NITRITE Negative 10/13/2015 1548   LEUKOCYTESUR Negative 10/13/2015 1548    Pertinent Imaging: none  Assessment & Plan:    1. Erectile dysfunction, unspecified erectile dysfunction type Pt defers treatment at this time - Urinalysis, Complete  2. Dysuria resolved - Urinalysis, Complete   No Follow-up on file.  Wilkie Aye, MD  [facility] [address] [place], [place] [address] [phone]

## 2015-12-29 ENCOUNTER — Encounter: Payer: Self-pay | Admitting: Urology

## 2015-12-29 ENCOUNTER — Ambulatory Visit (INDEPENDENT_AMBULATORY_CARE_PROVIDER_SITE_OTHER): Payer: Commercial Managed Care - PPO | Admitting: Urology

## 2015-12-29 VITALS — BP 137/85 | HR 71 | Ht 72.0 in | Wt 253.5 lb

## 2015-12-29 DIAGNOSIS — N529 Male erectile dysfunction, unspecified: Secondary | ICD-10-CM | POA: Diagnosis not present

## 2015-12-29 DIAGNOSIS — R300 Dysuria: Secondary | ICD-10-CM | POA: Diagnosis not present

## 2015-12-29 NOTE — Progress Notes (Signed)
12/29/2015 3:49 PM   Louis Ortiz 02/05/1966 [id number]  Referring provider: Geoffery LyonsEric M Turner, PA [address] [place], [place] [address]  Chief Complaint  Patient presents with  . Follow-up    dysuria, ED    HPI: Louis Ortiz is a 49yo here for followup for dysuria and premature ejaculation. His dysuria has resolved.  He was prescribed paxil for premature ejaculation which he stopped due to side effects. He has not tried the lidocaine jelly He has issues with decreased libido.  Pt is having marital issues which is likely contributing to his decreased desire Testosterone was 394   PMH: Past Medical History  Diagnosis Date  . Anxiety   . Sleep hypopnea     Surgical History: Past Surgical History  Procedure Laterality Date  . Mass excision N/A 03/06/2015    Procedure: MINOR EXCISION OF MASS EXC DEEP SUBMENTAL LYMPH NODE;  Surgeon: Linus Salmonshapman McQueen, MD;  [facility];  Service: ENT;  Laterality: N/A;    Home Medications:    Medication List    Notice  As of 12/29/2015  3:49 PM   You have not been prescribed any medications.      Allergies: No Known Allergies  Family History: Family History  Problem Relation Age of Onset  . Diabetes Mother   . Hematuria Brother   . Kidney failure Brother   . Lung cancer Brother     Social History:  reports that he quit smoking about 21 years ago. He has never used smokeless tobacco. He reports that he does not drink alcohol or use illicit drugs.  ROS: UROLOGY Frequent Urination?: No Hard to postpone urination?: No Burning/pain with urination?: No Get up at night to urinate?: No Leakage of urine?: No Urine stream starts and stops?: No Trouble starting stream?: No Do you have to strain to urinate?: No Blood in urine?: No Urinary tract infection?: No Sexually transmitted disease?: No Injury to kidneys or bladder?: No Painful intercourse?: No Weak stream?: No Erection problems?: No Penile pain?:  No  Gastrointestinal Nausea?: No Vomiting?: No Indigestion/heartburn?: No Diarrhea?: No Constipation?: No  Constitutional Fever: No Night sweats?: No Weight loss?: No Fatigue?: Yes  Skin Skin rash/lesions?: No Itching?: No  Eyes Blurred vision?: No Double vision?: No  Ears/Nose/Throat Sore throat?: No Sinus problems?: No  Hematologic/Lymphatic Swollen glands?: No Easy bruising?: No  Cardiovascular Leg swelling?: No Chest pain?: No  Respiratory Cough?: No Shortness of breath?: No  Endocrine Excessive thirst?: No  Musculoskeletal Back pain?: No Joint pain?: No  Neurological Headaches?: No Dizziness?: No  Psychologic Depression?: No Anxiety?: No  Physical Exam: BP 137/85 mmHg  Pulse 71  Ht 6' (1.829 m)  Wt 114.987 kg (253 lb 8 oz)  BMI 34.37 kg/m2  Constitutional:  Alert and oriented, No acute distress. HEENT: [place] AT, moist mucus membranes.  Trachea midline, no masses. Cardiovascular: No clubbing, cyanosis, or edema. Respiratory: Normal respiratory effort, no increased work of breathing. GI: Abdomen is soft, nontender, nondistended, no abdominal masses GU: No CVA tenderness.  Skin: No rashes, bruises or suspicious lesions. Lymph: No cervical or inguinal adenopathy. Neurologic: Grossly intact, no focal deficits, moving all 4 extremities. Psychiatric: Normal mood and affect.  Laboratory Data: Lab Results  Component Value Date   WBC 6.7 01/20/2015   HGB 15.7 01/20/2015   HCT 46.8 01/20/2015   MCV 89.6 01/20/2015   PLT 200 01/20/2015    Lab Results  Component Value Date   CREATININE 1.15 01/20/2015    No  results found for: PSA  Lab Results  Component Value Date   TESTOSTERONE 395 10/22/2015    No results found for: HGBA1C  Urinalysis    Component Value Date/Time   APPEARANCEUR Clear 11/10/2015 1542   GLUCOSEU Negative 11/10/2015 1542   BILIRUBINUR Negative 11/10/2015 1542   PROTEINUR 1+* 11/10/2015 1542   NITRITE Negative  11/10/2015 1542   LEUKOCYTESUR Negative 11/10/2015 1542    Pertinent Imaging: none  Assessment & Plan:    1. Premature ejaculation -continue lidocaine gel PRN -RTC 3 months  2. Dysuria resolved - Urinalysis, Complete   No Follow-up on file.  Wilkie Aye, MD  [facility] [address] [place], [place] [address] [phone]

## 2015-12-30 LAB — URINALYSIS, COMPLETE
Bilirubin, UA: NEGATIVE
GLUCOSE, UA: NEGATIVE
Ketones, UA: NEGATIVE
LEUKOCYTES UA: NEGATIVE
Nitrite, UA: NEGATIVE
PH UA: 6 (ref 5.0–7.5)
PROTEIN UA: NEGATIVE
RBC, UA: NEGATIVE
Specific Gravity, UA: 1.025 (ref 1.005–1.030)
Urobilinogen, Ur: 1 mg/dL (ref 0.2–1.0)

## 2015-12-30 LAB — MICROSCOPIC EXAMINATION: BACTERIA UA: NONE SEEN

## 2016-03-29 ENCOUNTER — Encounter: Payer: Self-pay | Admitting: Urology

## 2016-03-29 ENCOUNTER — Ambulatory Visit: Payer: Commercial Managed Care - PPO | Admitting: Urology

## 2016-09-15 ENCOUNTER — Other Ambulatory Visit: Payer: Self-pay

## 2018-06-25 ENCOUNTER — Emergency Department
Admission: EM | Admit: 2018-06-25 | Discharge: 2018-06-25 | Disposition: A | Discharge status: Home / Self Care | Payer: Self-pay | Attending: Emergency Medicine | Admitting: Emergency Medicine

## 2018-06-25 ENCOUNTER — Other Ambulatory Visit: Payer: Self-pay

## 2018-06-25 ENCOUNTER — Encounter: Payer: Self-pay | Admitting: Emergency Medicine

## 2018-06-25 DIAGNOSIS — Y990 Civilian activity done for income or pay: Secondary | ICD-10-CM | POA: Insufficient documentation

## 2018-06-25 DIAGNOSIS — S46911A Strain of unspecified muscle, fascia and tendon at shoulder and upper arm level, right arm, initial encounter: Secondary | ICD-10-CM | POA: Insufficient documentation

## 2018-06-25 DIAGNOSIS — Y9389 Activity, other specified: Secondary | ICD-10-CM | POA: Insufficient documentation

## 2018-06-25 DIAGNOSIS — Y9289 Other specified places as the place of occurrence of the external cause: Secondary | ICD-10-CM | POA: Insufficient documentation

## 2018-06-25 DIAGNOSIS — Z87891 Personal history of nicotine dependence: Secondary | ICD-10-CM | POA: Insufficient documentation

## 2018-06-25 DIAGNOSIS — Y33XXXA Other specified events, undetermined intent, initial encounter: Secondary | ICD-10-CM | POA: Insufficient documentation

## 2018-06-25 MED ORDER — PREDNISONE 10 MG (21) PO TBPK: ORAL_TABLET | ORAL | 0 refills | Status: AC

## 2018-06-25 MED ORDER — BACLOFEN 10 MG PO TABS: 10.0000 mg | ORAL_TABLET | Freq: Three times a day (TID) | ORAL | 1 refills | Status: AC

## 2018-06-25 NOTE — ED Provider Notes (Signed)
[facility] Emergency Department Provider Note  ____________________________________________   First MD Initiated Contact with Patient 06/25/18 [phone]     (approximate)  I have reviewed the triage vital signs and the nursing notes.   HISTORY  Chief Complaint Shoulder Injury    HPI Louis Ortiz is a 52 y.o. male resents emergency department complaining of right shoulder pain.  He states he was at work on Thursday and was pulling a dolly with his right arm and had pain afterwards.  He states now he has some numbness and tingling into the right hand.  Denies any neck pain or back injury.  He states he has full range of motion of his shoulders just the pain coming into his hand he is concerned about.  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    Past Medical History:  Diagnosis Date  . Anxiety   . Sleep hypopnea     Patient Active Problem List   Diagnosis Date Noted  . Erectile dysfunction 10/13/2015  . Dysuria 10/13/2015    Past Surgical History:  Procedure Laterality Date  . MASS EXCISION N/A 03/06/2015   Procedure: MINOR EXCISION OF MASS EXC DEEP SUBMENTAL LYMPH NODE;  Surgeon: Linus Salmonshapman McQueen, MD;  [facility];  Service: ENT;  Laterality: N/A;    Prior to Admission medications   Medication Sig Start Date End Date Taking? Authorizing Provider  baclofen (LIORESAL) 10 MG tablet Take 1 tablet (10 mg total) by mouth 3 (three) times daily. 06/25/18 06/25/19  Kevyn Boquet, Roselyn BeringSusan W, PA-C  predniSONE (STERAPRED UNI-PAK 21 TAB) 10 MG (21) TBPK tablet Take 6 pills on day one then decrease by 1 pill each day 06/25/18   Faythe GheeFisher, Nedda Gains W, PA-C    Allergies Patient has no known allergies.  Family History  Problem Relation Age of Onset  . Diabetes Mother   . Hematuria Brother   . Kidney failure Brother   . Lung cancer Brother     Social History Social History   Tobacco Use  . Smoking status: Former Smoker    Last attempt to quit: 01/19/1994      Years since quitting: 24.4  . Smokeless tobacco: Never Used  Substance Use Topics  . Alcohol use: No  . Drug use: No    Review of Systems  Constitutional: No fever/chills Eyes: No visual changes. ENT: No sore throat. Respiratory: Denies cough Genitourinary: Negative for dysuria. Musculoskeletal: Negative for back pain.  Positive right shoulder pain Skin: Negative for rash.    ____________________________________________   PHYSICAL EXAM:  VITAL SIGNS: ED Triage Vitals  Enc Vitals Group     BP 06/25/18 0728 (!) 159/105     Pulse Rate 06/25/18 0728 73     Resp 06/25/18 0728 16     Temp 06/25/18 0728 97.9 F (36.6 C)     Temp Source 06/25/18 0728 Oral     SpO2 06/25/18 0728 97 %     Weight 06/25/18 0722 253 lb 8.5 oz (115 kg)     Height --      Head Circumference --      Peak Flow --      Pain Score 06/25/18 0721 5     Pain Loc --      Pain Edu? --      Excl. in GC? --     Constitutional: Alert and oriented. Well appearing and in no acute distress. Eyes: Conjunctivae are normal.  Head: Atraumatic. Nose: No congestion/rhinnorhea. Mouth/Throat: Mucous membranes  are moist.   Neck:  supple no lymphadenopathy noted Cardiovascular: Normal rate, regular rhythm.  Respiratory: Normal respiratory effort.  No retractions GU: deferred Musculoskeletal: FROM all extremities, warm and well perfused.  There is a large spasm noted in the right trapezius muscle.  Grips are equal bilaterally.  And patient does have full range of motion with no impingement. Neurologic:  Normal speech and language.  Skin:  Skin is warm, dry and intact. No rash noted. Psychiatric: Mood and affect are normal. Speech and behavior are normal.  ____________________________________________   LABS (all labs ordered are listed, but only abnormal results are displayed)  Labs Reviewed - No data to  display ____________________________________________   ____________________________________________  RADIOLOGY    ____________________________________________   PROCEDURES  Procedure(s) performed: No  Procedures    ____________________________________________   INITIAL IMPRESSION / ASSESSMENT AND PLAN / ED COURSE  Pertinent labs & imaging results that were available during my care of the patient were reviewed by me and considered in my medical decision making (see chart for details).   Patient is 52 year old male presents emergency department complaining of right shoulder pain.  Physical exam shows a spasm in the right shoulder.  Explained findings to the patient.  He was given a prescription for Sterapred and baclofen.  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  He was given light duty as no driving and no use of the right arm while at work.  He is to apply ice to the right shoulder.  Follow-up with orthopedics if not better 5 7 days.  States he understands will comply.  He was discharged in stable condition.     As part of my medical decision making, I reviewed the following data within the electronic MEDICAL RECORD NUMBER History obtained from family, Old chart reviewed, Notes from prior ED visits and [place] Controlled Substance Database  ____________________________________________   FINAL CLINICAL IMPRESSION(S) / ED DIAGNOSES  Final diagnoses:  Right shoulder strain, initial encounter      NEW MEDICATIONS STARTED DURING THIS VISIT:  New Prescriptions   BACLOFEN (LIORESAL) 10 MG TABLET    Take 1 tablet (10 mg total) by mouth 3 (three) times daily.   PREDNISONE (STERAPRED UNI-PAK 21 TAB) 10 MG (21) TBPK TABLET    Take 6 pills on day one then decrease by 1 pill each day     Note:  This document was prepared using Dragon voice recognition software and may include unintentional dictation errors.    Faythe Ghee, PA-C 06/25/18 [id number]    Sharman Cheek, MD 06/25/18 [phone]

## 2018-06-25 NOTE — ED Notes (Signed)
Patient's injury occurred at work on Thursday.  [facility] for [facility]'s company [facility].  Unable to find company listing.  Company names given by patient [employer], [employer], and [employer].

## 2018-06-25 NOTE — ED Notes (Signed)
See triage note  Presents with pain to right shoulder and moving into right arm    States he developed pain after moving a dolly at work   States this happened last Thursday

## 2018-06-25 NOTE — ED Triage Notes (Signed)
C/O right shoulder pain, states injured shoulder at work on Thursday.

## 2018-06-25 NOTE — Discharge Instructions (Signed)
Apply ice to shoulder.  Take medication as prescribed.  Follow-up with orthopedics if not better in 5 7 days.

## 2018-07-18 ENCOUNTER — Other Ambulatory Visit: Payer: Self-pay | Admitting: Orthopedic Surgery

## 2018-07-18 DIAGNOSIS — M5030 Other cervical disc degeneration, unspecified cervical region: Secondary | ICD-10-CM

## 2018-07-18 DIAGNOSIS — M5412 Radiculopathy, cervical region: Secondary | ICD-10-CM

## 2018-09-20 ENCOUNTER — Other Ambulatory Visit: Payer: Self-pay | Admitting: Internal Medicine

## 2018-09-20 DIAGNOSIS — R7989 Other specified abnormal findings of blood chemistry: Secondary | ICD-10-CM

## 2018-09-20 DIAGNOSIS — R945 Abnormal results of liver function studies: Principal | ICD-10-CM

## 2018-09-25 ENCOUNTER — Ambulatory Visit: Payer: Self-pay
